# Patient Record
Sex: Female | Born: 2000 | Race: Black or African American | Hispanic: No | Marital: Single | Smoking: Never smoker
Health system: Southern US, Community
[De-identification: ages and names within clinical notes are randomized; demographics above are authoritative.]

## PROBLEM LIST (undated history)

## (undated) DIAGNOSIS — T7840XA Allergy, unspecified, initial encounter: Secondary | ICD-10-CM

## (undated) DIAGNOSIS — E860 Dehydration: Secondary | ICD-10-CM

## (undated) DIAGNOSIS — F419 Anxiety disorder, unspecified: Secondary | ICD-10-CM

## (undated) DIAGNOSIS — R51 Headache: Secondary | ICD-10-CM

## (undated) DIAGNOSIS — J189 Pneumonia, unspecified organism: Secondary | ICD-10-CM

## (undated) DIAGNOSIS — K219 Gastro-esophageal reflux disease without esophagitis: Secondary | ICD-10-CM

## (undated) HISTORY — DX: Pneumonia, unspecified organism: J18.9

## (undated) HISTORY — PX: ADENOIDECTOMY: SUR15

## (undated) HISTORY — DX: Dehydration: E86.0

## (undated) HISTORY — PX: TYMPANOSTOMY: SHX2586

## (undated) HISTORY — DX: Allergy, unspecified, initial encounter: T78.40XA

## (undated) HISTORY — DX: Headache: R51

---
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
## 2021-01-01 ENCOUNTER — Other Ambulatory Visit: Payer: Self-pay | Admitting: Allergy and Immunology

## 2021-01-14 DIAGNOSIS — Z419 Encounter for procedure for purposes other than remedying health state, unspecified: Secondary | ICD-10-CM | POA: Diagnosis not present

## 2021-01-24 ENCOUNTER — Telehealth: Payer: Self-pay

## 2021-01-24 NOTE — Telephone Encounter (Signed)
Hi Dr Billey Gosling,  This patient was on your schedule for tomorrow 1/12 and left a voicemail today cancelling d/t her ride being sick and not being able to take her. I checked her chart, and it looks like she has been scheduled quite a few times for new patient appointments and has cancelled within 24 hours for 6 of those appointments. I checked with Angie and she advised that the patient should be dismissed d/t no shows, but wanted me to check with you as you should be the one to enter the dismissal. Can you please let me know if you mind entering the dismissal for this patient?  Thanks!

## 2021-01-25 ENCOUNTER — Other Ambulatory Visit: Payer: Self-pay | Admitting: Psychiatry

## 2021-01-25 ENCOUNTER — Ambulatory Visit: Payer: Medicaid Other | Admitting: Psychiatry

## 2021-01-25 NOTE — Telephone Encounter (Signed)
Yes we can dismiss the patient from the practice. I haven't had to dismiss any patients yet so let me know if there's anything specific I need to do, thanks!

## 2021-02-07 ENCOUNTER — Encounter: Payer: Self-pay | Admitting: Psychiatry

## 2021-02-14 DIAGNOSIS — Z419 Encounter for procedure for purposes other than remedying health state, unspecified: Secondary | ICD-10-CM | POA: Diagnosis not present

## 2021-03-14 DIAGNOSIS — Z419 Encounter for procedure for purposes other than remedying health state, unspecified: Secondary | ICD-10-CM | POA: Diagnosis not present

## 2021-03-19 ENCOUNTER — Ambulatory Visit: Payer: Medicaid Other | Admitting: Pediatrics

## 2021-04-11 ENCOUNTER — Ambulatory Visit: Payer: Medicaid Other | Admitting: Pediatrics

## 2021-04-14 DIAGNOSIS — Z419 Encounter for procedure for purposes other than remedying health state, unspecified: Secondary | ICD-10-CM | POA: Diagnosis not present

## 2021-04-23 ENCOUNTER — Ambulatory Visit (INDEPENDENT_AMBULATORY_CARE_PROVIDER_SITE_OTHER): Payer: Medicaid Other | Admitting: Pediatrics

## 2021-04-23 VITALS — BP 116/70 | HR 105 | Ht <= 58 in | Wt 92.4 lb

## 2021-04-23 DIAGNOSIS — Z114 Encounter for screening for human immunodeficiency virus [HIV]: Secondary | ICD-10-CM | POA: Diagnosis not present

## 2021-04-23 DIAGNOSIS — Z113 Encounter for screening for infections with a predominantly sexual mode of transmission: Secondary | ICD-10-CM

## 2021-04-23 DIAGNOSIS — Z0001 Encounter for general adult medical examination with abnormal findings: Secondary | ICD-10-CM | POA: Diagnosis not present

## 2021-04-23 DIAGNOSIS — N898 Other specified noninflammatory disorders of vagina: Secondary | ICD-10-CM | POA: Diagnosis not present

## 2021-04-23 DIAGNOSIS — Z6820 Body mass index (BMI) 20.0-20.9, adult: Secondary | ICD-10-CM | POA: Diagnosis not present

## 2021-04-23 LAB — POCT RAPID HIV: Rapid HIV, POC: NEGATIVE

## 2021-04-23 NOTE — Progress Notes (Addendum)
Adolescent Well Care Visit ?Danielle Green is a 21 y.o. female who is here for well care. ?   ?PCP:  Theadore Nan, MD ? ? History was provided by the patient and mother. ? ?Current Issues: ?Current concerns include: ? ?Most recent visits: ?10/2019 last visit to clinic for irregular period and anxiety ?Last clinic visit at Asthma clinic 12/2019: asthma, GER, no HA, acne noted ?09/2019 seen in Neurology for migraine ?Last well care in this clinic 12/2018 ? ?Current new issue ?Had a smell in vaginal discharge for about months ?Recently period been on time--is usually irregular ?Period recently, 3/31-started ?Usually duration 8-9 days ?Now every month for 3-4 months ?No stomach pain ? ?Not migraines as often as she used to  ? ?Uses inhalers if cold ?Not use allergy medicine ? ?Hair grew back --had a prior prescription for terbinafine for hair loss ? ?Anxiety is "not that bad" ? ?Using OTC differin  ?Omeprazole use--like once a year  ? ?Nutrition: ?Nutrition/Eating Behaviors: eats well, food appetite,  ?Previously has been treated for eating disorder and malnutrition ?Adequate calcium in diet?:  No ?Supplements/ Vitamins: None ? ?Exercise/ Media: ?Play any Sports?/ Exercise: no exercise ?Screen Time:  > 2 hours-counseling provided ?Media Rules or Monitoring?: no ? ?Sleep:  ?Sleep: last summer was talking too late into the night instead sleeping ?Now sleeping better ?No longer talking all night on the phone  ? ?Social Screening: ?Lives with:  just mom and her at home, mom has older adult children  ?Mom's spouse, patient's adopted father, died 13 to 15 years ago ?Parental relations:  good ?Mom been having problem with her back and if she needs to walk long distance ?Mom has lost a lot of weight ?Adopted from a couple days after  birth after foster --was told last summer by mother, it was a secret until then ? ?Education: graduated [employer] school May 2022  ?Not working ? ?Confidential Social History: ?Tobacco?   no ?Secondhand smoke exposure?  no ?Drugs/ETOH?  no ? ?Sexually Active?  no   ?Pregnancy Prevention: None ? ?Screenings: ?Patient has a dental home:  Been a longtime ? ?The patient completed the Rapid Assessment for Adolescent Preventive Services screening questionnaire and the following topics were identified as risk factors and discussed: healthy eating and exercise  ?In addition, the following topics were discussed as part of anticipatory guidance mental health issues. ? ?PHQ-9 completed and results indicated low risk score of 0 ? ?Physical Exam:  ?Vitals:  ? 04/23/21 1452  ?BP: 116/70  ?Pulse: (!) 105  ?SpO2: 97%  ?Weight: 92 lb 6.4 oz (41.9 kg)  ?Height: 4' 8.89" (1.445 m)  ? ?BP 116/70   Pulse (!) 105   Ht 4' 8.89" (1.445 m)   Wt 92 lb 6.4 oz (41.9 kg)   SpO2 97%   BMI 20.07 kg/m?  ?Body mass index: body mass index is 20.07 kg/m?. ?Blood pressure percentiles are not available for patients who are 18 years or older. ? ?Hearing Screening  ?Method: Audiometry  ? 500Hz  1000Hz  2000Hz  4000Hz   ?Right ear 20 20 20 20   ?Left ear 20 20 20 20   ? ?Vision Screening  ? Right eye Left eye Both eyes  ?Without correction 20/16 20/16 20/16   ?With correction     ? ? ?General Appearance:   alert, oriented, no acute distress  ?HENT: Normocephalic, no obvious abnormality, conjunctiva clear  ?Mouth:   Normal appearing teeth, no obvious discoloration, dental caries, or dental caps  ?Neck:  Supple; thyroid: no enlargement, symmetric, no tenderness/mass/nodules  ?Chest Normal female  ?Lungs:   Clear to auscultation bilaterally, normal work of breathing  ?Heart:   Regular rate and rhythm, S1 and S2 normal, no murmurs;   ?Abdomen:   Soft, non-tender, no mass, or organomegaly  ?GU normal female external genitalia, pelvic not performed, pubic hair extends up to umbilicus, no significant discharge, mild odor, exam after samples obtained  ?Musculoskeletal:   Tone and strength strong and symmetrical, all extremities             ?   ?Lymphatic:   No cervical adenopathy  ?Skin/Hair/Nails:   Skin warm, dry and intact, no rashes, no bruises or petechiae  ?Neurologic:   Strength, gait, and coordination normal and age-appropriate  ? ? ? ?Assessment and Plan:  ? ?1. Encounter for general adult medical examination with abnormal findings ? ?Her problem is includes many issues that are either partially or completely resolved. 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  She continues to show physical signs of increased testosterone ? ?She says she is no longer as anxious as she used to be but she seems quite anxious on exam ?She also has difficulty providing much history without aid by her mother.  She has a history of learning disorder and language use disorder ? ?She reports occasional abdominal pain but also reports using omeprazole only once a year ? ?2. Routine screening for STI (sexually transmitted infection) ? ?- Urine cytology ancillary only ?- POCT Rapid HIV ?- WET PREP BY MOLECULAR PROBE ? ?3. BMI 20.0-20.9, adult ?Much improved BMI with apparently increased appetite ? ?4. Vaginal discharge ? ?Denies sexual activity differential diagnosis includes bacterial vaginosis, yeast infection, retained foreign body. ?Consider OB/GYN if no findings from screening labs ? ?Addendum: screening labs do no show infectious cause for discharge ?Refer to [facility] gyn.  ?She does not use tampons, but I wonder if there is retained tissues or of FB.  ?Denies sexual activity  ? ?Discussed time for transition to clinic which cares for adults ? ?BMI is appropriate for age--- first time in years ?Says she is eating better ? ?Hearing screening result:normal ?Vision screening result: normal ? ? ?Orders Placed This Encounter  ?Procedures  ? WET PREP BY MOLECULAR PROBE  ? POCT Rapid HIV   ? ? ?Theadore Nan, MD ? ? ? ?

## 2021-04-23 NOTE — Patient Instructions (Signed)
Adult Primary Care Clinics Name Criteria Services   [facility]  Address: [address] [place], [place] [address]  Phone: [phone] Hours: Monday - Friday 9 AM -6 PM    Not currently taking new Patients, due to move into [facility], expected new patient acceptance time March/April 2023  Types of insurance accepted:  Commercial insurance [facility] (orange card) Medicaid Medicare Uninsured  Language services:  Video and phone interpreters available   Ages 18 and older    Adult primary care Onsite pharmacy Integrated behavioral health Financial assistance counseling Walk-in hours for established patients  Financial assistance counseling hours: Tuesdays 2:00PM - 5:00PM  Thursday 8:30AM - 4:30PM  Space is limited, 10 on Tuesday and 20 on Thursday. It's on first come first serve basis  Name Criteria Services   [facility]  Address: [address] [place], [place] [address]  Phone: [phone]  Hours: Monday - Friday 8:30 AM - 5 PM  Types of insurance accepted:  Commercial insurance Medicaid Medicare Uninsured  Language services:  Video and phone interpreters available   All ages - newborn to adult   Primary care for all ages (children and adults) Integrated behavioral health Nutritionist Financial assistance counseling   Name Criteria Services   [facility]  Located on the ground floor of [facility]  Address: [address]  [place],  [place]  [address]  Phone: [phone]  Hours: Monday - Friday 8:15 AM - 5 PM  Types of insurance accepted:  Commercial insurance Medicaid Medicare Uninsured  Language services:  Video and phone interpreters available   Ages 18 and older   Adult primary care Nutritionist Certified Diabetes Educator  Integrated behavioral health Financial assistance counseling   Name  Criteria Services   [facility]  Address: [address] [place], [place] [address]  Phone: [phone]  Hours: Monday - Friday 8:30 AM - 5 PM    Types of insurance accepted:  Commercial insurance Medicaid Medicare Uninsured  Language services:  Video and phone interpreters available   All ages - newborn to adult   Primary care for all ages (children and adults) Integrated behavioral health Financial assistance counseling    

## 2021-04-24 LAB — WET PREP BY MOLECULAR PROBE
Candida species: NOT DETECTED
Gardnerella vaginalis: NOT DETECTED
MICRO NUMBER:: 13243429
SPECIMEN QUALITY:: ADEQUATE
Trichomonas vaginosis: NOT DETECTED

## 2021-04-24 LAB — URINE CYTOLOGY ANCILLARY ONLY
Chlamydia: NEGATIVE
Comment: NEGATIVE
Comment: NORMAL
Neisseria Gonorrhea: NEGATIVE

## 2021-04-24 NOTE — Addendum Note (Signed)
Addended by: Theadore Nan on: 04/24/2021 09:12 AM ? ? Modules accepted: Orders ? ?

## 2021-04-24 NOTE — Progress Notes (Signed)
I left detailed message on Danielle Green's identified VM relaying message from Dr. Kathlene November.

## 2021-05-03 ENCOUNTER — Telehealth: Payer: Self-pay

## 2021-05-03 NOTE — Telephone Encounter (Signed)
Left message for pt to call office back regarding referral that was sent to [facility].  

## 2021-05-14 DIAGNOSIS — Z419 Encounter for procedure for purposes other than remedying health state, unspecified: Secondary | ICD-10-CM | POA: Diagnosis not present

## 2021-06-14 DIAGNOSIS — Z419 Encounter for procedure for purposes other than remedying health state, unspecified: Secondary | ICD-10-CM | POA: Diagnosis not present

## 2021-07-11 ENCOUNTER — Encounter: Payer: Medicaid Other | Admitting: Obstetrics & Gynecology

## 2021-07-14 DIAGNOSIS — Z419 Encounter for procedure for purposes other than remedying health state, unspecified: Secondary | ICD-10-CM | POA: Diagnosis not present

## 2021-08-14 DIAGNOSIS — Z419 Encounter for procedure for purposes other than remedying health state, unspecified: Secondary | ICD-10-CM | POA: Diagnosis not present

## 2021-09-14 DIAGNOSIS — Z419 Encounter for procedure for purposes other than remedying health state, unspecified: Secondary | ICD-10-CM | POA: Diagnosis not present

## 2021-10-14 DIAGNOSIS — Z419 Encounter for procedure for purposes other than remedying health state, unspecified: Secondary | ICD-10-CM | POA: Diagnosis not present

## 2021-11-14 DIAGNOSIS — Z419 Encounter for procedure for purposes other than remedying health state, unspecified: Secondary | ICD-10-CM | POA: Diagnosis not present

## 2021-12-14 DIAGNOSIS — Z419 Encounter for procedure for purposes other than remedying health state, unspecified: Secondary | ICD-10-CM | POA: Diagnosis not present

## 2022-01-14 DIAGNOSIS — Z419 Encounter for procedure for purposes other than remedying health state, unspecified: Secondary | ICD-10-CM | POA: Diagnosis not present

## 2022-02-14 DIAGNOSIS — Z419 Encounter for procedure for purposes other than remedying health state, unspecified: Secondary | ICD-10-CM | POA: Diagnosis not present

## 2022-03-15 DIAGNOSIS — Z419 Encounter for procedure for purposes other than remedying health state, unspecified: Secondary | ICD-10-CM | POA: Diagnosis not present

## 2022-04-15 DIAGNOSIS — Z419 Encounter for procedure for purposes other than remedying health state, unspecified: Secondary | ICD-10-CM | POA: Diagnosis not present

## 2022-05-15 DIAGNOSIS — Z419 Encounter for procedure for purposes other than remedying health state, unspecified: Secondary | ICD-10-CM | POA: Diagnosis not present

## 2022-06-15 DIAGNOSIS — Z419 Encounter for procedure for purposes other than remedying health state, unspecified: Secondary | ICD-10-CM | POA: Diagnosis not present

## 2022-07-15 DIAGNOSIS — Z419 Encounter for procedure for purposes other than remedying health state, unspecified: Secondary | ICD-10-CM | POA: Diagnosis not present

## 2022-08-15 DIAGNOSIS — Z419 Encounter for procedure for purposes other than remedying health state, unspecified: Secondary | ICD-10-CM | POA: Diagnosis not present

## 2022-09-15 DIAGNOSIS — Z419 Encounter for procedure for purposes other than remedying health state, unspecified: Secondary | ICD-10-CM | POA: Diagnosis not present

## 2022-10-15 DIAGNOSIS — Z419 Encounter for procedure for purposes other than remedying health state, unspecified: Secondary | ICD-10-CM | POA: Diagnosis not present

## 2022-11-15 DIAGNOSIS — Z419 Encounter for procedure for purposes other than remedying health state, unspecified: Secondary | ICD-10-CM | POA: Diagnosis not present
